# Patient Record
Sex: Male | Born: 1980 | Hispanic: Yes | Marital: Married | Smoking: Never smoker
Health system: Southern US, Community
[De-identification: ages and names within clinical notes are randomized; demographics above are authoritative.]

## PROBLEM LIST (undated history)

## (undated) DIAGNOSIS — R51 Headache: Secondary | ICD-10-CM

## (undated) DIAGNOSIS — G8929 Other chronic pain: Secondary | ICD-10-CM

## (undated) HISTORY — DX: Other chronic pain: G89.29

## (undated) HISTORY — DX: Headache: R51

---

## 2003-11-26 ENCOUNTER — Emergency Department (HOSPITAL_COMMUNITY): Admission: EM | Admit: 2003-11-26 | Discharge: 2003-11-26 | Payer: Self-pay | Admitting: Emergency Medicine

## 2010-10-21 ENCOUNTER — Encounter: Payer: Self-pay | Admitting: Internal Medicine

## 2010-10-21 DIAGNOSIS — Z0000 Encounter for general adult medical examination without abnormal findings: Secondary | ICD-10-CM | POA: Insufficient documentation

## 2010-10-25 ENCOUNTER — Other Ambulatory Visit (INDEPENDENT_AMBULATORY_CARE_PROVIDER_SITE_OTHER): Payer: BC Managed Care – PPO

## 2010-10-25 ENCOUNTER — Ambulatory Visit (INDEPENDENT_AMBULATORY_CARE_PROVIDER_SITE_OTHER): Payer: BC Managed Care – PPO | Admitting: Internal Medicine

## 2010-10-25 ENCOUNTER — Other Ambulatory Visit: Payer: Self-pay | Admitting: Internal Medicine

## 2010-10-25 ENCOUNTER — Ambulatory Visit (INDEPENDENT_AMBULATORY_CARE_PROVIDER_SITE_OTHER)
Admission: RE | Admit: 2010-10-25 | Discharge: 2010-10-25 | Disposition: A | Discharge status: Home / Self Care | Payer: BC Managed Care – PPO | Source: Ambulatory Visit | Attending: Internal Medicine | Admitting: Internal Medicine

## 2010-10-25 ENCOUNTER — Encounter: Payer: Self-pay | Admitting: Internal Medicine

## 2010-10-25 VITALS — BP 102/70 | HR 69 | Temp 98.5°F | Ht 62.0 in | Wt 149.8 lb

## 2010-10-25 DIAGNOSIS — L02619 Cutaneous abscess of unspecified foot: Secondary | ICD-10-CM

## 2010-10-25 DIAGNOSIS — R0989 Other specified symptoms and signs involving the circulatory and respiratory systems: Secondary | ICD-10-CM

## 2010-10-25 DIAGNOSIS — Z0000 Encounter for general adult medical examination without abnormal findings: Secondary | ICD-10-CM

## 2010-10-25 DIAGNOSIS — L600 Ingrowing nail: Secondary | ICD-10-CM | POA: Insufficient documentation

## 2010-10-25 DIAGNOSIS — R0683 Snoring: Secondary | ICD-10-CM

## 2010-10-25 DIAGNOSIS — G43909 Migraine, unspecified, not intractable, without status migrainosus: Secondary | ICD-10-CM | POA: Insufficient documentation

## 2010-10-25 DIAGNOSIS — R0609 Other forms of dyspnea: Secondary | ICD-10-CM

## 2010-10-25 DIAGNOSIS — L03032 Cellulitis of left toe: Secondary | ICD-10-CM

## 2010-10-25 LAB — URINALYSIS, ROUTINE W REFLEX MICROSCOPIC
Bilirubin Urine: NEGATIVE
Ketones, ur: NEGATIVE
Leukocytes, UA: NEGATIVE
Specific Gravity, Urine: 1.02 (ref 1.000–1.030)
Total Protein, Urine: NEGATIVE
Urine Glucose: NEGATIVE
pH: 7 (ref 5.0–8.0)

## 2010-10-25 LAB — CBC WITH DIFFERENTIAL/PLATELET
Eosinophils Relative: 1.3 % (ref 0.0–5.0)
HCT: 42.2 % (ref 39.0–52.0)
Hemoglobin: 14.3 g/dL (ref 13.0–17.0)
Lymphs Abs: 1.8 10*3/uL (ref 0.7–4.0)
MCV: 93.4 fl (ref 78.0–100.0)
Monocytes Absolute: 0.5 10*3/uL (ref 0.1–1.0)
Monocytes Relative: 6.3 % (ref 3.0–12.0)
Neutro Abs: 5.7 10*3/uL (ref 1.4–7.7)
Platelets: 279 10*3/uL (ref 150.0–400.0)
WBC: 8.1 10*3/uL (ref 4.5–10.5)

## 2010-10-25 LAB — BASIC METABOLIC PANEL
BUN: 12 mg/dL (ref 6–23)
Calcium: 9.5 mg/dL (ref 8.4–10.5)
GFR: 138.02 mL/min (ref >60.00)
Potassium: 4.1 mEq/L (ref 3.5–5.1)
Sodium: 138 mEq/L (ref 135–145)

## 2010-10-25 LAB — TSH: TSH: 1.46 u[IU]/mL (ref 0.35–5.50)

## 2010-10-25 LAB — HEPATIC FUNCTION PANEL
AST: 33 U/L (ref 0–37)
Albumin: 4.6 g/dL (ref 3.5–5.2)
Alkaline Phosphatase: 103 U/L (ref 39–117)
Total Bilirubin: 0.5 mg/dL (ref 0.3–1.2)

## 2010-10-25 LAB — LDL CHOLESTEROL, DIRECT: Direct LDL: 135.4 mg/dL

## 2010-10-25 LAB — LIPID PANEL
HDL: 39.5 mg/dL (ref >39.00)
Triglycerides: 246 mg/dL — ABNORMAL HIGH (ref 0.0–149.0)
VLDL: 49.2 mg/dL — ABNORMAL HIGH (ref 0.0–40.0)

## 2010-10-25 MED ORDER — DOXYCYCLINE HYCLATE 100 MG PO TABS: 100.0000 mg | ORAL_TABLET | Freq: Two times a day (BID) | ORAL | Status: AC

## 2010-10-25 NOTE — Progress Notes (Signed)
Subjective:    Patient ID: Tyler Hodges, male    DOB: Oct 04, 1980, 30 y.o.   MRN: [id number]  HPI  Here for wellness and f/u;  Overall doing ok;  Pt denies CP, worsening SOB, DOE, wheezing, orthopnea, PND, worsening LE edema, palpitations, dizziness or syncope.  Pt denies neurological change such as new Headache, facial or extremity weakness.  Pt denies polydipsia, polyuria, or low sugar symptoms. Pt states overall good compliance with treatment and medications, good tolerability, and trying to follow lower cholesterol diet.  Pt denies worsening depressive symptoms, suicidal ideation or panic. No fever, wt loss, night sweats, loss of appetite, or other constitutional symptoms.  Pt states good ability with ADL's, low fall risk, home safety reviewed and adequate, no significant changes in hearing or vision, and occasionally active with exercise.  Also c/o severe snoring at night, very severe, wife here as well , seemed to stop breathing last night and she had to shake him, very difficult to wake up anytime, even in the AM, most days does not feel sleep well.  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 On days he does not work, does not try to fall  No prior CT scan of sinus, No significant allergy symptoms.   Also with left great toe ingrown nail, but now also with red, swellng assoc for 3 days.  Typicla Migraines occur about 2/mo, less severe in recent yr, ibuprofen works well, overall mild Past Medical History  Diagnosis Date  . Chronic headaches   . Migraine    No past surgical history on file.  reports that he has never smoked. He does not have any smokeless tobacco history on file. He reports that he does not drink alcohol or use illicit drugs. family history includes Hypertension in his mother. No Known Allergies No current outpatient prescriptions on file prior to visit.   Review of Systems Review of Systems  Constitutional: Negative  for diaphoresis, activity change, appetite change and unexpected weight change.  HENT: Negative for hearing loss, ear pain, facial swelling, mouth sores and neck stiffness.   Eyes: Negative for pain, redness and visual disturbance.  Respiratory: Negative for shortness of breath and wheezing.   Cardiovascular: Negative for chest pain and palpitations.  Gastrointestinal: Negative for diarrhea, blood in stool, abdominal distention and rectal pain.  Genitourinary: Negative for hematuria, flank pain and decreased urine volume.  Musculoskeletal: Negative for myalgias and joint swelling.  Skin: Negative for color change and wound.  Neurological: Negative for syncope and numbness.  Hematological: Negative for adenopathy.  Psychiatric/Behavioral: Negative for hallucinations, self-injury, decreased concentration and agitation.      Objective:   Physical Exam BP 102/70  Pulse 69  Temp(Src) 98.5 F (36.9 C) (Oral)  Ht 5\' 2"  (1.575 m)  Wt 149 lb 12 oz (67.926 kg)  BMI 27.39 kg/m2  SpO2 98% Physical Exam  VS noted, mild overwt Constitutional: Pt is oriented to person, place, and time. Appears well-developed and well-nourished.  Head: Normocephalic and atraumatic.  Right Ear: External ear normal.  Left Ear: External ear normal.  Nose: Nose normal. No deviation appreciated of septum Mouth/Throat: Oropharynx is clear and moist.  Eyes: Conjunctivae and EOM are normal. Pupils are equal, round, and reactive to light.  Neck: Normal range of motion. Neck supple. No JVD present. No tracheal deviation present.  Cardiovascular: Normal rate, regular rhythm, normal heart sounds and intact distal pulses.   Pulmonary/Chest: Effort normal and  breath sounds normal.  Abdominal: Soft. Bowel sounds are normal. There is no tenderness.  Musculoskeletal: Normal range of motion. Exhibits no edema.  Lymphadenopathy:  Has no cervical adenopathy.  Neurological: Pt is alert and oriented to person, place, and time. Pt  has normal reflexes. No cranial nerve deficit.  Skin: Skin is warm and dry. No rash noted. except for paronychia/mild cellultiis left great medial toe 1 cm area - nonfluctuant, nondraining Psychiatric:  Has  normal mood and affect. Behavior is normal. except mild nervous only    Assessment & Plan:

## 2010-10-25 NOTE — Assessment & Plan Note (Signed)
Mild to mod, for antibx course,  to f/u any worsening symptoms or concerns 

## 2010-10-25 NOTE — Assessment & Plan Note (Addendum)
With rather severe hx of fatigue, hard to wake up, and possible apnea spells likely some form of upper airway resistance syndrome - for sinus CT , ENT eval, consider sleep study, no evidence by exam of allergies or other obvious ent issue;   to f/u any worsening symptoms or concerns

## 2010-10-25 NOTE — Assessment & Plan Note (Signed)
Mild to mod, for podiatry referral,  to f/u any worsening symptoms or concerns 

## 2010-10-25 NOTE — Patient Instructions (Signed)
Take all new medications as prescribed - the antibiotic You will be contacted regarding the referral for: CT scan for the sinus  (see the [facility]  - ? Able to do today) You will be contacted regarding the referral for: ENT, and podiatry Continue all other medications as before - otc excedrin migraine Please go to LAB in the Basement for the blood and/or urine tests to be done today Please call the phone number [phone] (the [employer] System) for results of testing in 2-3 days;  When calling, simply dial the number, and when prompted enter the MRN number above (the Medical Record Number) and the # key, then the message should start.

## 2010-10-25 NOTE — Assessment & Plan Note (Signed)
stable overall by hx and exam, and pt to continue medical treatment as before 

## 2010-10-25 NOTE — Assessment & Plan Note (Signed)
Overall doing well, age appropriate education and counseling updated, referrals for preventative services and immunizations addressed, dietary and smoking counseling addressed, most recent labs and ECG reviewed.  I have personally reviewed and have noted: 1) the patient's medical and social history 2) The pt's use of alcohol, tobacco, and illicit drugs 3) The patient's current medications and supplements 4) Functional ability including ADL's, fall risk, home safety risk, hearing and visual impairment 5) Diet and physical activities 6) Evidence for depression or mood disorder 7) The patient's height, weight, and BMI have been recorded in the chart I have made referrals, and provided counseling and education based on review of the above Declines tetanus/flu today

## 2010-12-06 ENCOUNTER — Encounter: Payer: Self-pay | Admitting: Internal Medicine

## 2010-12-06 ENCOUNTER — Ambulatory Visit (INDEPENDENT_AMBULATORY_CARE_PROVIDER_SITE_OTHER): Payer: BC Managed Care – PPO | Admitting: Internal Medicine

## 2010-12-06 ENCOUNTER — Encounter: Payer: Self-pay | Admitting: *Deleted

## 2010-12-06 VITALS — BP 110/72 | HR 125 | Temp 102.5°F

## 2010-12-06 DIAGNOSIS — J069 Acute upper respiratory infection, unspecified: Secondary | ICD-10-CM

## 2010-12-06 MED ORDER — HYDROCODONE-HOMATROPINE 5-1.5 MG/5ML PO SYRP: 5.0000 mL | ORAL_SOLUTION | Freq: Four times a day (QID) | ORAL | Status: AC | PRN

## 2010-12-06 NOTE — Patient Instructions (Signed)
It was good to see you today. If you develop worsening symptoms or fever, call and we can reconsider antibiotics, but it does not appear necessary to use antibiotics at this time. Prescription cough medication has been submitted to your pharmacy -  also use Tylenol Cold and flu as discussed and maintain adequate hydration plus rest Work note provided for you as discussed, okay to return on Monday if no fever and if feeling better  Viral Syndrome You or your child has Viral Syndrome. It is the most common infection causing "colds" and infections in the nose, throat, sinuses, and breathing tubes. Sometimes the infection causes nausea, vomiting, or diarrhea. The germ that causes the infection is a virus. No antibiotic or other medicine will kill it. There are medicines that you can take to make you or your child more comfortable.   HOME CARE INSTRUCTIONS    Rest in bed until you start to feel better.     If you have diarrhea or vomiting, eat small amounts of crackers and toast. Soup is helpful.     Do not give aspirin or medicine that contains aspirin to children.     Only take over-the-counter or prescription medicines for pain, discomfort, or fever as directed by your caregiver.  SEEK IMMEDIATE MEDICAL CARE IF:    You or your child has not improved within one week.     You or your child has pain that is not at least partially relieved by over-the-counter medicine.     Thick, colored mucus or blood is coughed up.     Discharge from the nose becomes thick yellow or green.     Diarrhea or vomiting gets worse.     There is any major change in your or your child's condition.     You or your child develops a skin rash, stiff neck, severe headache, or are unable to hold down food or fluid.     You or your child has an oral temperature above 102 F (38.9 C), not controlled by medicine.    Document Released: 12/22/2005 Document Revised: 09/18/2010 Document Reviewed: 12/23/2006 [facility]  Patient Information 2012 [place], [place].

## 2010-12-06 NOTE — Progress Notes (Signed)
  Subjective:    Patient ID: Tyler Hodges, male    DOB: 15-Jul-1980, 30 y.o.   MRN: [id number]  URI  This is a new problem. The current episode started yesterday. The problem has been unchanged. The maximum temperature recorded prior to his arrival was 102 - 102.9 F. The fever has been present for 1 to 2 days. Associated symptoms include coughing (dry), headaches, rhinorrhea and a sore throat. Pertinent negatives include no chest pain, dysuria, ear pain, joint pain, rash or sinus pain. He has tried nothing for the symptoms.    PMH reviewed  Review of Systems  HENT: Positive for sore throat and rhinorrhea. Negative for ear pain.   Respiratory: Positive for cough (dry).   Cardiovascular: Negative for chest pain.  Genitourinary: Negative for dysuria.  Musculoskeletal: Negative for joint pain.  Skin: Negative for rash.  Neurological: Positive for headaches.       Objective:   Physical Exam BP 110/72  Pulse 125  Temp(Src) 102.5 F (39.2 C) (Oral)  SpO2 98% Constitutional:  He appears tired but nontoxic -well-developed and well-nourished.  HENT: NCAT, nontender sinus, clear rhinorrhea; OP mild erythema but no postnasal drip or exudate Eyes: PERRLA, EOMI no conjunctivitis Neck: Normal range of motion. Neck supple, shoddy LAD present. No thyromegaly present.  Cardiovascular: Normal rate, regular rhythm and normal heart sounds.  No murmur heard. no BLE edema Pulmonary/Chest: Effort normal and breath sounds normal. No respiratory distress. no wheezes.  Neurological: he is alert and oriented to person, place, and time. No cranial nerve deficit. Coordination normal.  Skin: Skin is warm and dry.  No rash, erythema or ulceration.  Psychiatric: he has a normal mood and affect. behavior is normal. Judgment and thought content normal.        Assessment & Plan:  Viral URI - precipitated by sick contact with 8-year-old daughter who had same No evidence of exam or history for bacterial  infection- explained lack of efficacy of antibiotics in viral disease Symptomatic care recommended: Narcotic cough medication prescribed, over-the-counter Tylenol cold and flu recommended Patient to call if symptoms unimproved or worse in next 72 hours

## 2012-03-12 IMAGING — CT CT PARANASAL SINUSES LIMITED
1 of 2 series · 16 of 19 positions shown, 20 images · non-contrast
Comparison: None.

CLINICAL DATA: 30-year-old male with snoring, apnea, fatigue, query
sinusitis.

CT PARANASAL SINUS LIMITED WITHOUT CONTRAST

[Series 4: ltd sinus 3.0 h30s · axial · 0.36mm/px · z∈[-77,+18]mm · 16 of 18 slices shown, 20 images]
[im 2/18  brain]
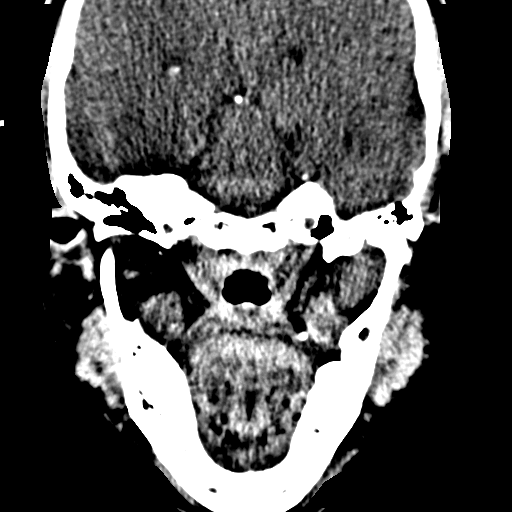
[im 2/18  bone]
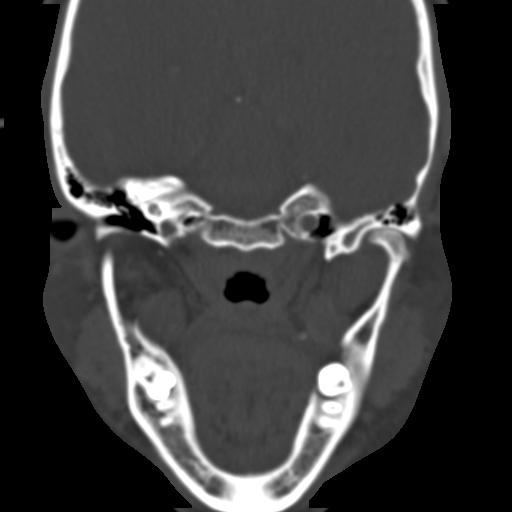
[im 3/18  bone]
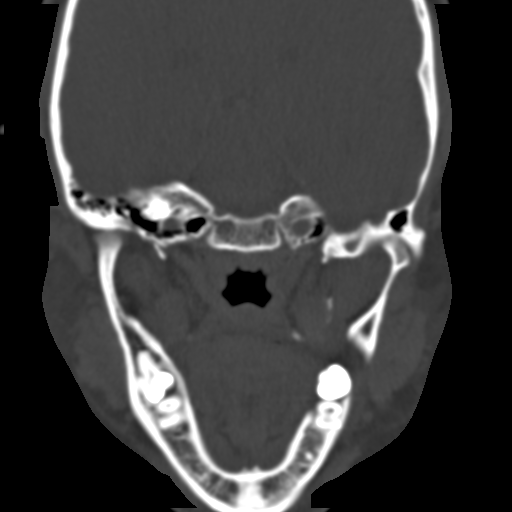
[im 4/18  bone]
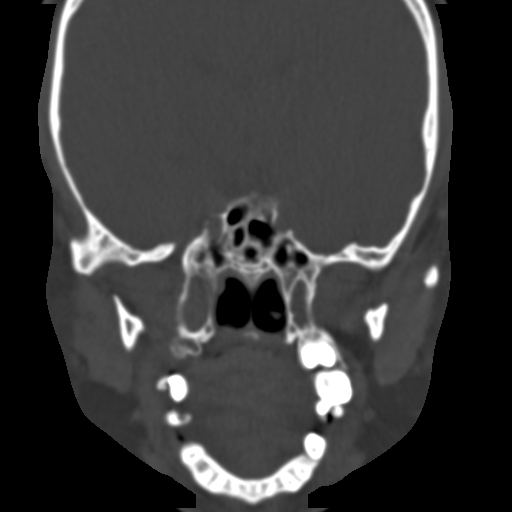
[im 5/18  bone]
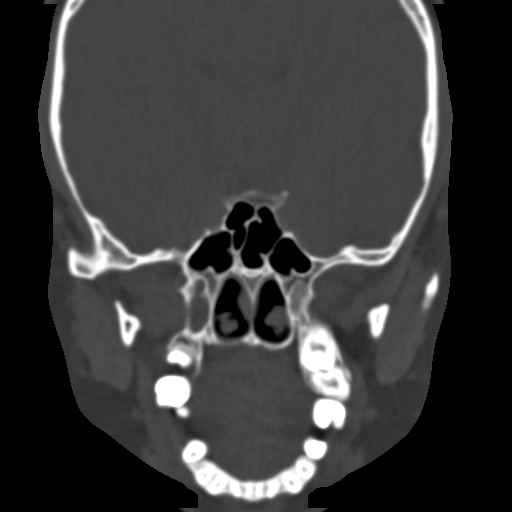
[im 6/18  brain]
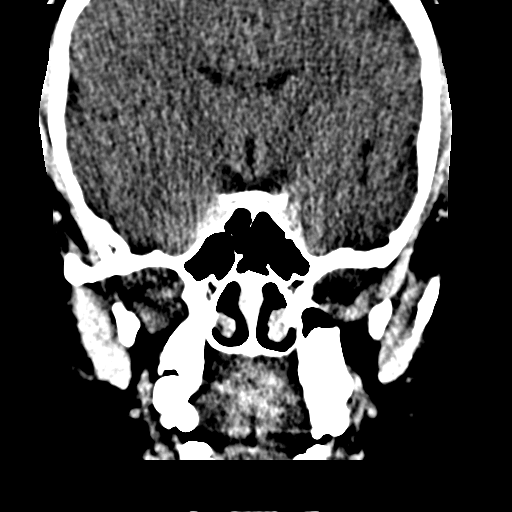
[im 6/18  bone]
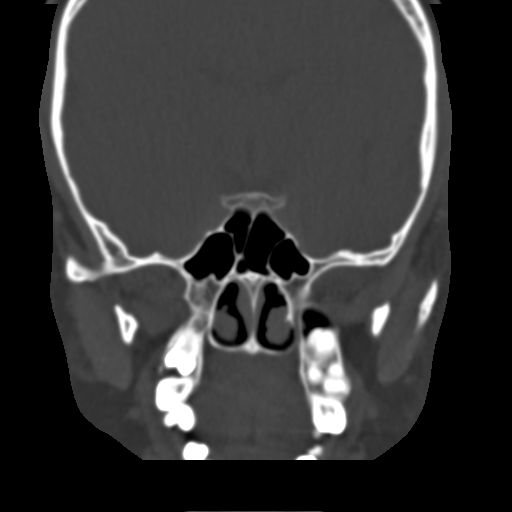
[im 7/18  bone]
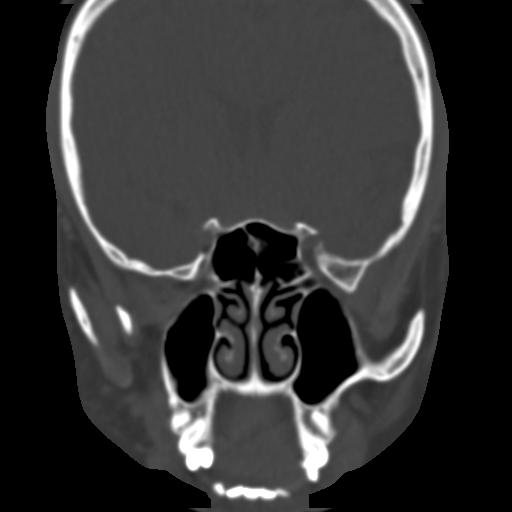
[im 8/18  bone]
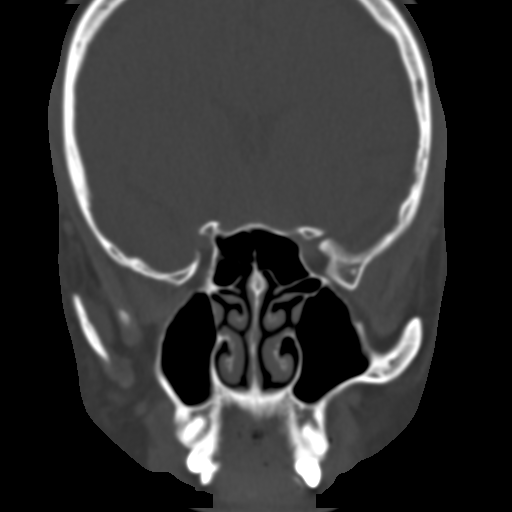
[im 9/18  bone]
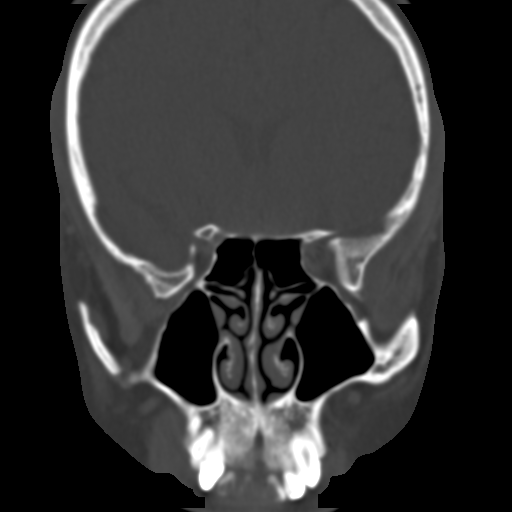
[im 10/18  brain]
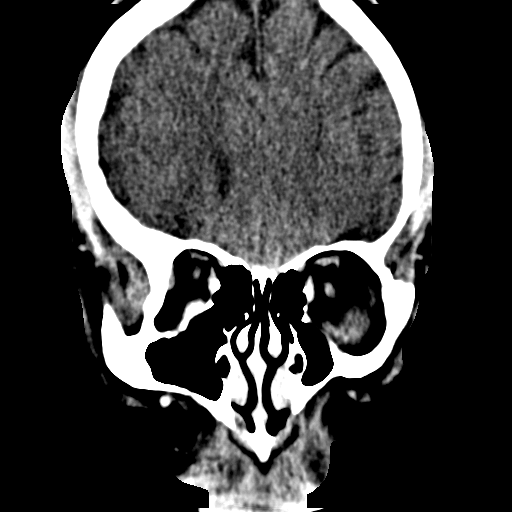
[im 10/18  bone]
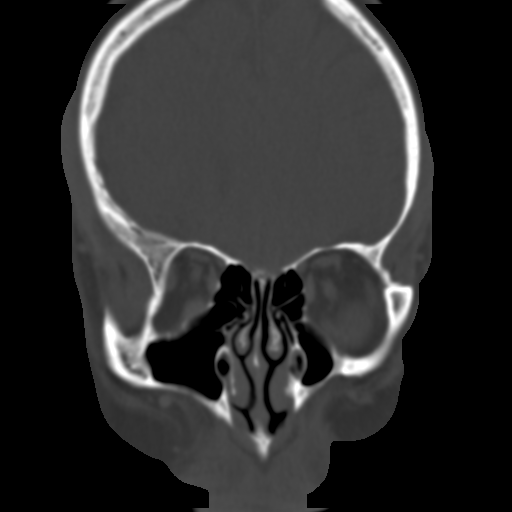
[im 11/18  bone]
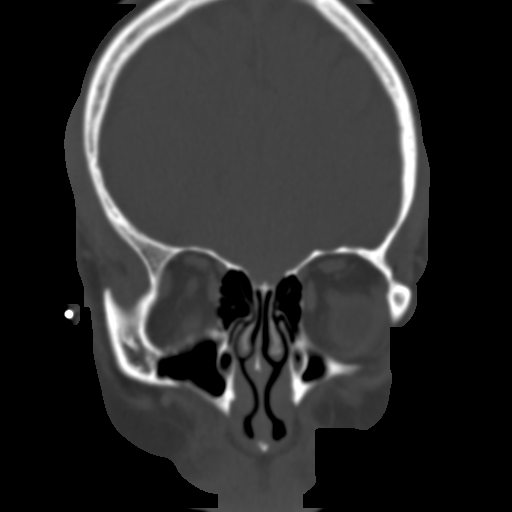
[im 12/18  bone]
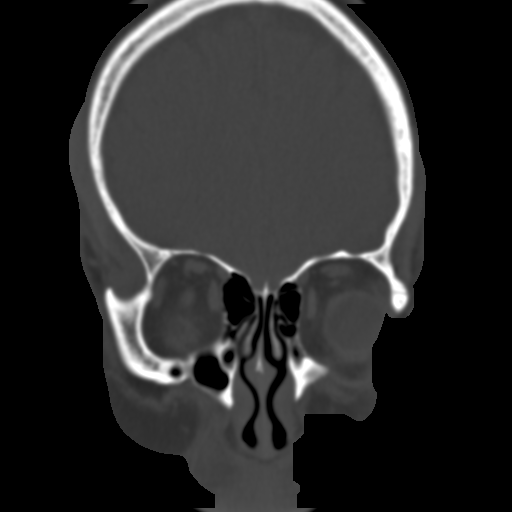
[im 13/18  bone]
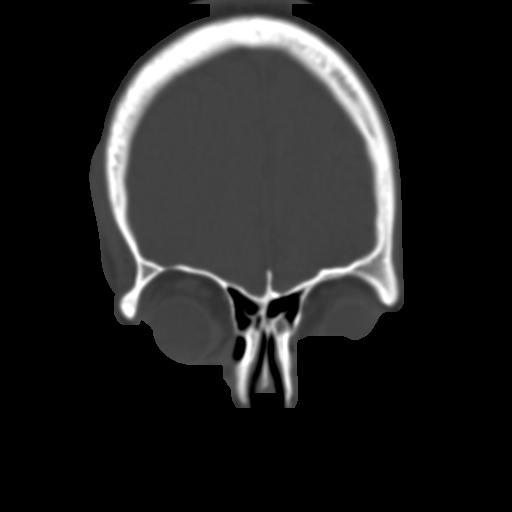
[im 14/18  brain]
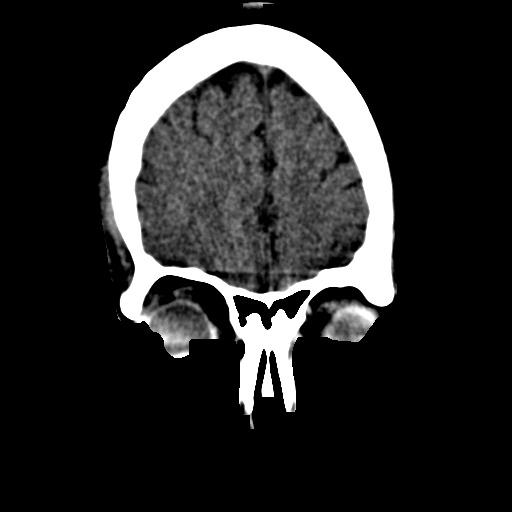
[im 14/18  bone]
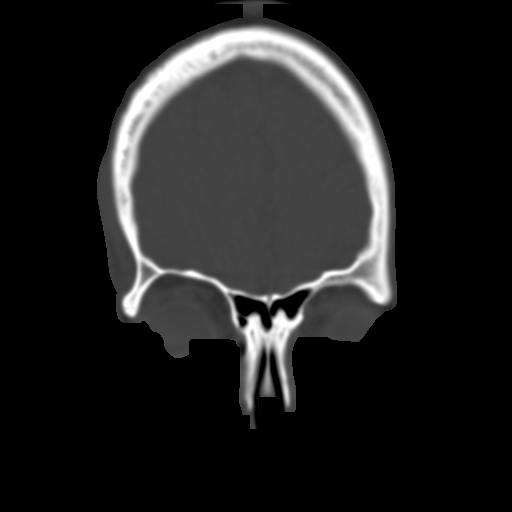
[im 15/18  bone]
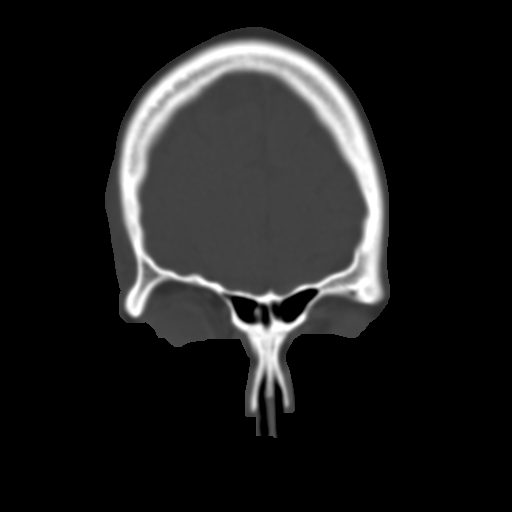
[im 16/18  bone]
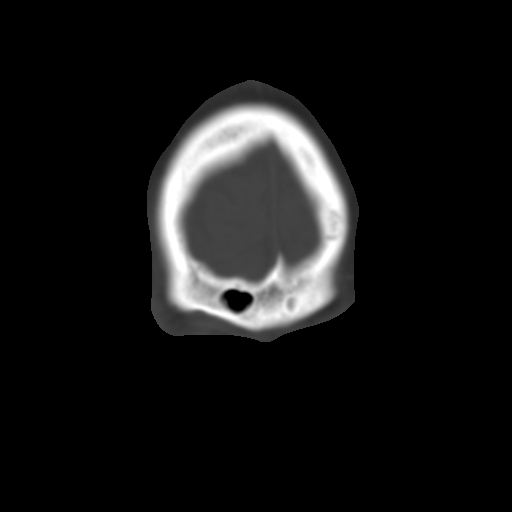
[im 17/18  bone]
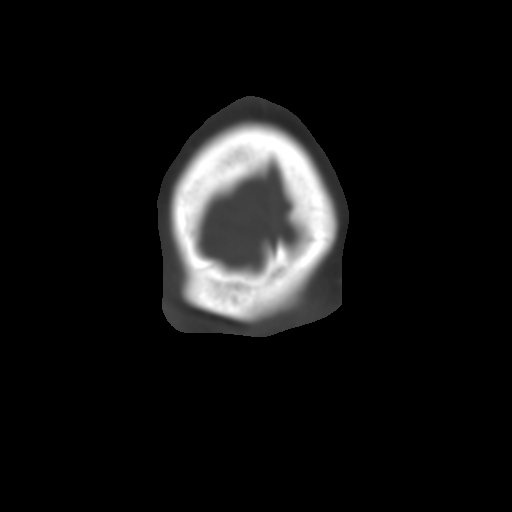

[16 of 19 positions shown; findings below may reference images not displayed]

FINDINGS: Negative visualized noncontrast brain parenchyma.
Visualized orbit soft tissues are within normal limits.  Negative
visualized superficial face soft tissues.  Negative visualized deep
face soft tissues; only a portion of the nasopharynx is included
but it appears normal.

Visualized mastoids and tympanic cavities are clear.

Sphenoid sinuses are clear.
Ethmoid air cells are clear.
Frontal sinuses are hypoplastic and clear.
Maxillary sinuses are clear.

Nasal cavity mucosal thickening suspected.  Mild rightward nasal
septal deviation.

No acute osseous abnormality identified.
IMPRESSION: Normal paranasal sinuses.

## 2014-04-28 ENCOUNTER — Encounter: Payer: Self-pay | Admitting: Internal Medicine

## 2014-04-28 DIAGNOSIS — Z0289 Encounter for other administrative examinations: Secondary | ICD-10-CM

## 2017-02-25 ENCOUNTER — Emergency Department (INDEPENDENT_AMBULATORY_CARE_PROVIDER_SITE_OTHER): Payer: BLUE CROSS/BLUE SHIELD

## 2017-02-25 ENCOUNTER — Other Ambulatory Visit: Payer: Self-pay

## 2017-02-25 ENCOUNTER — Encounter: Payer: Self-pay | Admitting: *Deleted

## 2017-02-25 ENCOUNTER — Emergency Department (INDEPENDENT_AMBULATORY_CARE_PROVIDER_SITE_OTHER)
Admission: EM | Admit: 2017-02-25 | Discharge: 2017-02-25 | Disposition: A | Discharge status: Home / Self Care | Payer: BLUE CROSS/BLUE SHIELD | Source: Home / Self Care | Attending: Family Medicine | Admitting: Family Medicine

## 2017-02-25 DIAGNOSIS — S20212A Contusion of left front wall of thorax, initial encounter: Secondary | ICD-10-CM

## 2017-02-25 DIAGNOSIS — R079 Chest pain, unspecified: Secondary | ICD-10-CM | POA: Diagnosis not present

## 2017-02-25 NOTE — ED Triage Notes (Signed)
Pt c/o LT rib cage area pain x last night after falling off of a stool while painting and hitting his rib cage on a paint can. Reports last dose Tylenol at 1200 today.

## 2017-02-25 NOTE — ED Provider Notes (Signed)
[facility]    CSN: [id number] Arrival date & time: 02/25/17  1453     History   Chief Complaint Chief Complaint  Patient presents with  . Rib Injury    HPI Tyler Hodges is a 37 y.o. male.   Patient fell off a stool while painting last night, striking his left chest on a paint can.  He complains of pain in his left chest without shortness of breath.   The history is provided by the patient.  Chest Pain  Pain location:  L lateral chest Pain quality: aching   Pain radiates to:  Does not radiate Pain severity:  Moderate Onset quality:  Sudden Duration:  1 day Timing:  Constant Progression:  Unchanged Chronicity:  New Context: breathing, lifting, movement and at rest   Relieved by:  Nothing Worsened by:  Coughing, deep breathing and certain positions Ineffective treatments: Tylenol. Associated symptoms: no abdominal pain, no altered mental status, no back pain, no cough, no diaphoresis, no fatigue, no fever, no shortness of breath and no syncope     Past Medical History:  Diagnosis Date  . Chronic headaches   . Migraine     Patient Active Problem List   Diagnosis Date Noted  . Snoring 10/25/2010  . Ingrown nail 10/25/2010  . Cellulitis of toe of left foot 10/25/2010  . Migraine   . Preventative health care 10/21/2010    History reviewed. No pertinent surgical history.     Home Medications    Prior to Admission medications   Not on File    Family History Family History  Problem Relation Age of Onset  . Hypertension Mother     Social History Social History   Tobacco Use  . Smoking status: Never Smoker  . Smokeless tobacco: Never Used  Substance Use Topics  . Alcohol use: No  . Drug use: No     Allergies   Patient has no known allergies.   Review of Systems Review of Systems  Constitutional: Negative for diaphoresis, fatigue and fever.  Respiratory: Negative for cough and shortness of breath.   Cardiovascular:  Positive for chest pain. Negative for syncope.  Gastrointestinal: Negative for abdominal pain.  Musculoskeletal: Negative for back pain.  All other systems reviewed and are negative.    Physical Exam Triage Vital Signs ED Triage Vitals [02/25/17 1516]  Enc Vitals Group     BP 121/75     Pulse Rate 69     Resp 16     Temp 98.2 F (36.8 C)     Temp Source Oral     SpO2 99 %     Weight 165 lb (74.8 kg)     Height      Head Circumference      Peak Flow      Pain Score 6     Pain Loc      Pain Edu?      Excl. in GC?    No data found.  Updated Vital Signs BP 121/75 (BP Location: Right Arm)   Pulse 69   Temp 98.2 F (36.8 C) (Oral)   Resp 16   Wt 165 lb (74.8 kg)   SpO2 99%   BMI 30.18 kg/m   Visual Acuity Right Eye Distance:   Left Eye Distance:   Bilateral Distance:    Right Eye Near:   Left Eye Near:    Bilateral Near:     Physical Exam  Constitutional: He appears well-developed and well-nourished. No  distress.  HENT:  Head: Atraumatic.  Right Ear: External ear normal.  Left Ear: External ear normal.  Nose: Nose normal.  Mouth/Throat: Oropharynx is clear and moist.  Eyes: Conjunctivae are normal. Pupils are equal, round, and reactive to light.  Neck: Normal range of motion.  Cardiovascular: Normal heart sounds.  Pulmonary/Chest: Effort normal and breath sounds normal.     He exhibits bony tenderness. He exhibits no tenderness, no laceration, no crepitus, no edema and no swelling.  Left lateral chest tenderness to palpation as noted on diagram.   Abdominal: There is no tenderness.  Neurological: He is alert.  Skin: Skin is warm and dry.  Nursing note and vitals reviewed.    UC Treatments / Results  Labs (all labs ordered are listed, but only abnormal results are displayed) Labs Reviewed - No data to display  EKG  EKG Interpretation None       Radiology Dg Ribs Unilateral W/chest Left  Result Date: 02/25/2017 CLINICAL DATA:  Larey SeatFell.   Pain. EXAM: LEFT RIBS AND CHEST - 3+ VIEW COMPARISON:  None. FINDINGS: No fracture or other bone lesions are seen involving the ribs. There is no evidence of pneumothorax or pleural effusion. Both lungs are clear. Heart size and mediastinal contours are within normal limits. IMPRESSION: Negative. Electronically Signed   By: Elsie StainJohn T Curnes M.D.   On: 02/25/2017 15:31    Procedures Procedures (including critical care time)  Medications Ordered in UC Medications - No data to display   Initial Impression / Assessment and Plan / UC Course  I have reviewed the triage vital signs and the nursing notes.  Pertinent labs & imaging results that were available during my care of the patient were reviewed by me and considered in my medical decision making (see chart for details).    Apply ice pack for 20 to 30 minutes, 3 to 4 times daily  Continue until pain and swelling decrease.  May take Ibuprofen 200mg , 4 tabs every 8 hours with food.   May take Tylenol for pain. Return for worsening symptoms.    Final Clinical Impressions(s) / UC Diagnoses   Final diagnoses:  Contusion of left chest wall, initial encounter    ED Discharge Orders    None         Lattie HawBeese, Ranald Alessio A, MD 02/28/17 [phone]

## 2017-02-25 NOTE — Discharge Instructions (Signed)
Apply ice pack for 20 to 30 minutes, 3 to 4 times daily  Continue until pain and swelling decrease.  May take Ibuprofen 200mg , 4 tabs every 8 hours with food.   May take Tylenol for pain.

## 2018-07-14 IMAGING — DX DG RIBS W/ CHEST 3+V*L*
3 series · 3 of 3 positions shown · non-contrast
Comparison: None.

CLINICAL DATA: Fell.  Pain.

EXAM:
LEFT RIBS AND CHEST - 3+ VIEW

[chest pa]
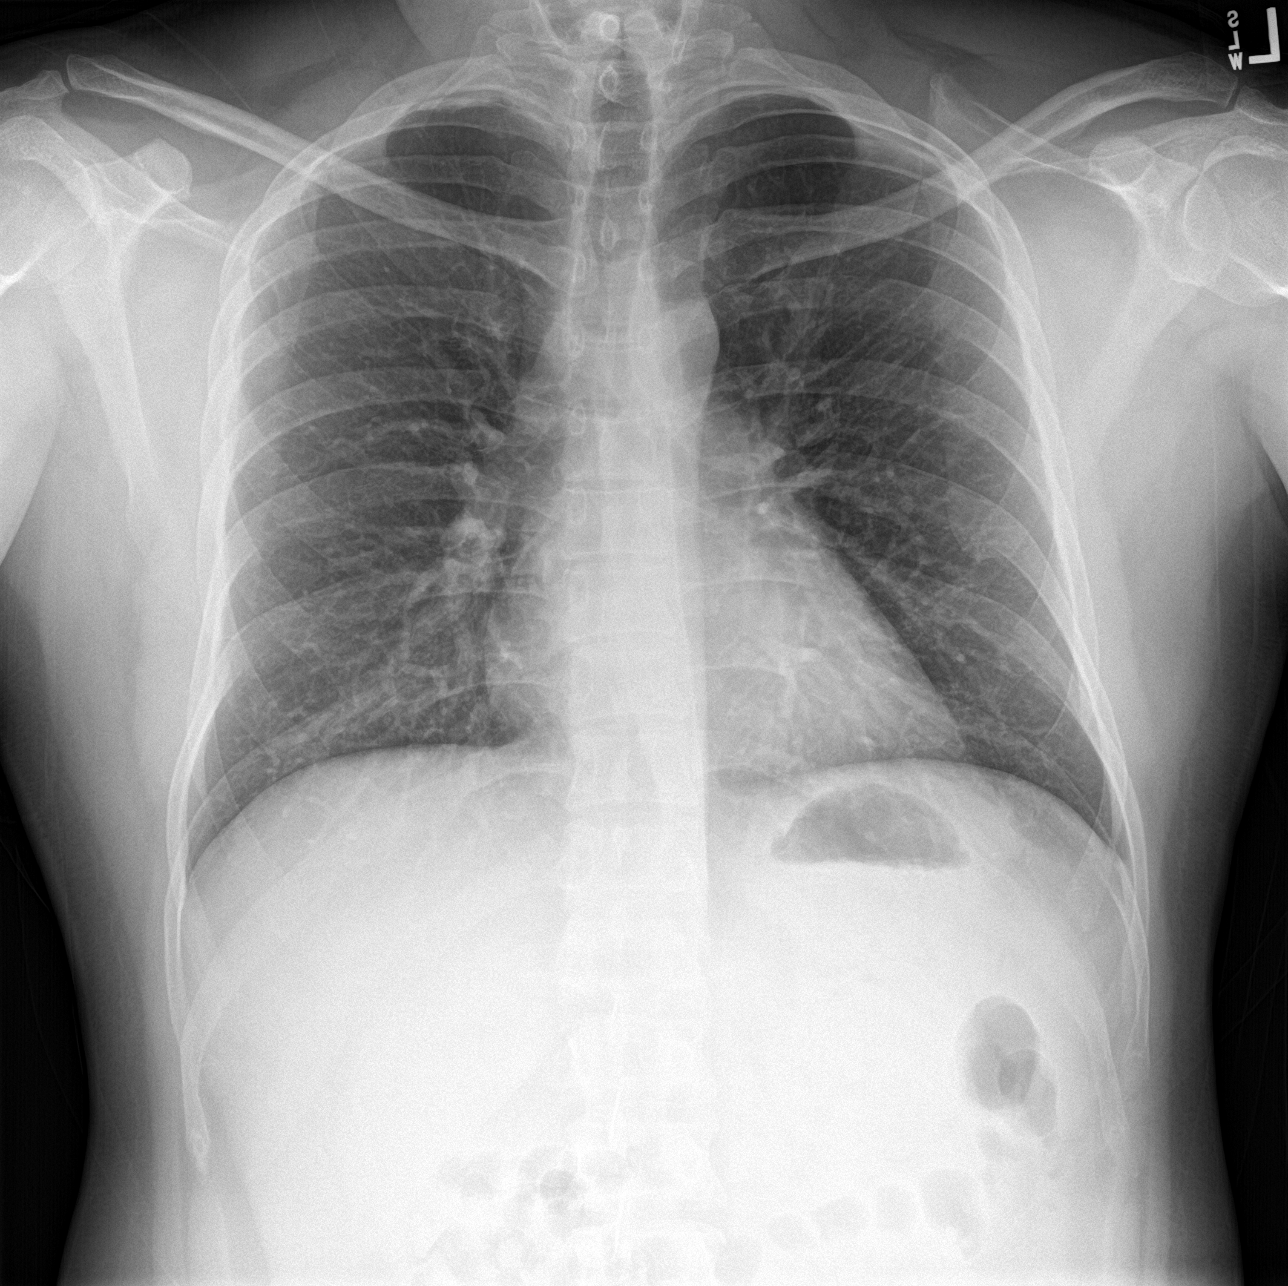

[rib ap]
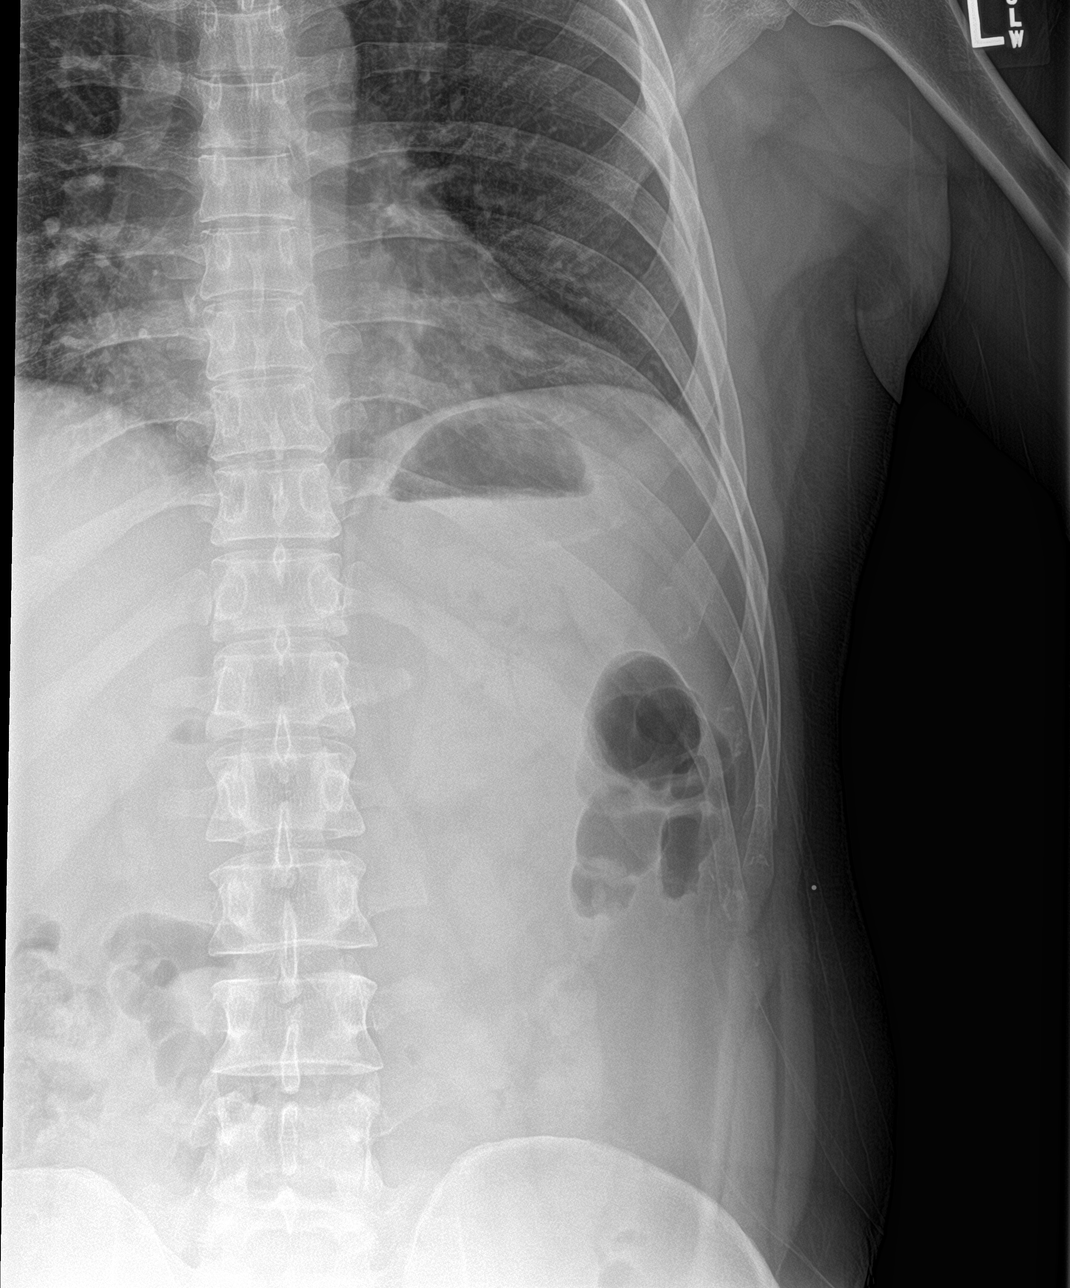

[rib ap obl]
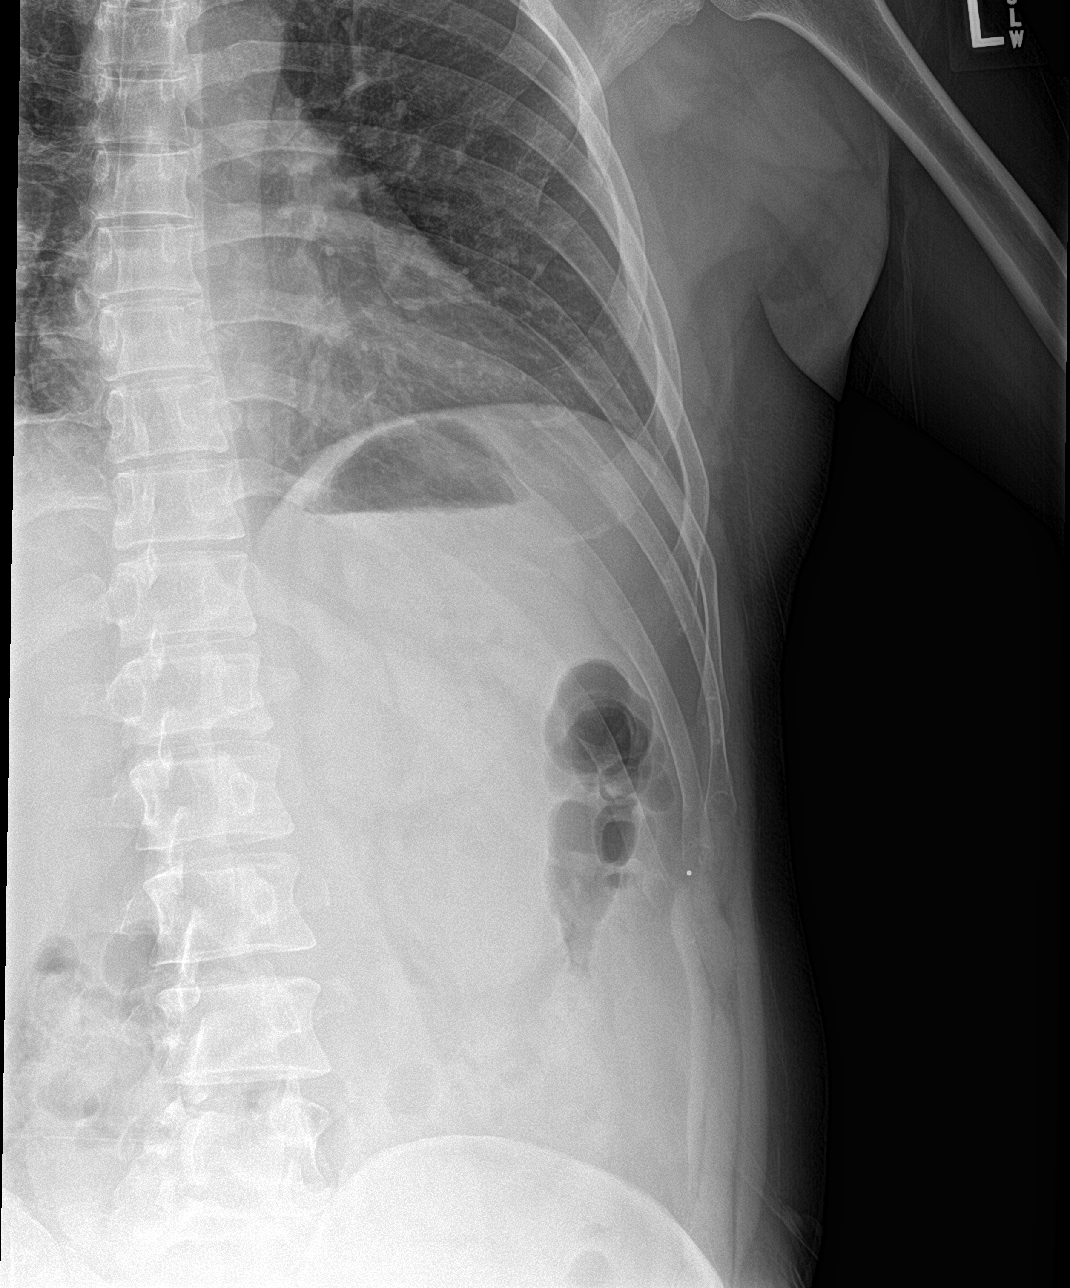

[3 of 3 positions shown; findings below may reference images not displayed]

FINDINGS: No fracture or other bone lesions are seen involving the ribs. There
is no evidence of pneumothorax or pleural effusion. Both lungs are
clear. Heart size and mediastinal contours are within normal limits.
IMPRESSION: Negative.
